# Patient Record
Sex: Female | Born: 1935 | Race: White | Hispanic: No | Marital: Married | Smoking: Former smoker
Health system: Southern US, Community
[De-identification: ages and names within clinical notes are randomized; demographics above are authoritative.]

## PROBLEM LIST (undated history)

## (undated) DIAGNOSIS — E7800 Pure hypercholesterolemia, unspecified: Secondary | ICD-10-CM

## (undated) DIAGNOSIS — D563 Thalassemia minor: Secondary | ICD-10-CM

## (undated) DIAGNOSIS — H3530 Unspecified macular degeneration: Secondary | ICD-10-CM

## (undated) DIAGNOSIS — K219 Gastro-esophageal reflux disease without esophagitis: Secondary | ICD-10-CM

## (undated) HISTORY — PX: ABDOMINAL HYSTERECTOMY: SHX81

---

## 1983-06-28 HISTORY — PX: BREAST SURGERY: SHX581

## 2004-08-12 ENCOUNTER — Ambulatory Visit: Payer: Self-pay | Admitting: Internal Medicine

## 2005-04-01 ENCOUNTER — Ambulatory Visit: Payer: Self-pay | Admitting: Unknown Physician Specialty

## 2005-09-22 ENCOUNTER — Ambulatory Visit: Payer: Self-pay | Admitting: Internal Medicine

## 2006-03-25 ENCOUNTER — Emergency Department: Payer: Self-pay | Admitting: Emergency Medicine

## 2006-10-02 ENCOUNTER — Ambulatory Visit: Payer: Self-pay | Admitting: Internal Medicine

## 2007-02-20 ENCOUNTER — Ambulatory Visit: Payer: Self-pay | Admitting: Unknown Physician Specialty

## 2007-12-09 ENCOUNTER — Ambulatory Visit: Payer: Self-pay | Admitting: Internal Medicine

## 2008-03-10 ENCOUNTER — Ambulatory Visit: Payer: Self-pay | Admitting: Nurse Practitioner

## 2008-05-01 ENCOUNTER — Emergency Department: Payer: Self-pay | Admitting: Emergency Medicine

## 2008-06-27 HISTORY — PX: EYE SURGERY: SHX253

## 2009-03-12 ENCOUNTER — Ambulatory Visit: Payer: Self-pay | Admitting: Nurse Practitioner

## 2009-04-11 ENCOUNTER — Ambulatory Visit: Payer: Self-pay | Admitting: Internal Medicine

## 2010-04-08 ENCOUNTER — Ambulatory Visit: Payer: Self-pay | Admitting: Nurse Practitioner

## 2011-07-13 ENCOUNTER — Ambulatory Visit: Payer: Self-pay

## 2011-12-18 ENCOUNTER — Ambulatory Visit: Payer: Self-pay | Admitting: Internal Medicine

## 2012-02-22 ENCOUNTER — Ambulatory Visit: Payer: Self-pay | Admitting: Ophthalmology

## 2012-03-14 ENCOUNTER — Ambulatory Visit: Payer: Self-pay | Admitting: Ophthalmology

## 2012-07-26 ENCOUNTER — Ambulatory Visit: Payer: Self-pay | Admitting: Internal Medicine

## 2013-08-01 ENCOUNTER — Ambulatory Visit: Payer: Self-pay | Admitting: Family Medicine

## 2014-08-19 ENCOUNTER — Ambulatory Visit: Payer: Self-pay | Admitting: Family Medicine

## 2015-03-12 ENCOUNTER — Ambulatory Visit
Admission: EM | Admit: 2015-03-12 | Discharge: 2015-03-12 | Disposition: A | Discharge status: Home / Self Care | Payer: Medicare Other | Attending: Family Medicine | Admitting: Family Medicine

## 2015-03-12 ENCOUNTER — Encounter: Payer: Self-pay | Admitting: Emergency Medicine

## 2015-03-12 DIAGNOSIS — T783XXD Angioneurotic edema, subsequent encounter: Secondary | ICD-10-CM | POA: Diagnosis not present

## 2015-03-12 HISTORY — DX: Gastro-esophageal reflux disease without esophagitis: K21.9

## 2015-03-12 HISTORY — DX: Pure hypercholesterolemia, unspecified: E78.00

## 2015-03-12 MED ORDER — PREDNISONE 10 MG (21) PO TBPK: ORAL_TABLET | ORAL | Status: AC

## 2015-03-12 MED ORDER — EPINEPHRINE 0.3 MG/0.3ML IJ SOAJ: 0.3000 mg | Freq: Once | INTRAMUSCULAR | Status: AC

## 2015-03-12 MED ORDER — LORATADINE 10 MG PO TABS: 10.0000 mg | ORAL_TABLET | Freq: Every day | ORAL | Status: AC

## 2015-03-12 MED ORDER — EPINEPHRINE HCL 1 MG/ML IJ SOLN
0.3000 mg | Freq: Once | INTRAMUSCULAR | Status: AC
  Administered 2015-03-12: 0.3 mg via SUBCUTANEOUS

## 2015-03-12 MED ORDER — LIDOCAINE-EPINEPHRINE-TETRACAINE (LET) SOLUTION: 3.0000 mL | Freq: Once | NASAL | Status: DC

## 2015-03-12 MED ORDER — METHYLPREDNISOLONE SODIUM SUCC 125 MG IJ SOLR
125.0000 mg | Freq: Once | INTRAMUSCULAR | Status: AC
  Administered 2015-03-12: 125 mg via INTRAMUSCULAR

## 2015-03-12 NOTE — ED Notes (Signed)
Patient c/o lip swelling that started last night.  Patient reports having an insect bite to her right hand on Saturday.

## 2015-03-12 NOTE — ED Provider Notes (Signed)
CSN: [id number]     Arrival date & time 03/12/15  7829 History   First MD Initiated Contact with Patient 03/12/15 0805     Chief Complaint  Patient presents with  . Oral Swelling    Patient has history of swelling of her lower lip and mouth that started yesterday. She states Saturday she was bitten by insect she has some swelling of her hand and some tingling of her lip that was all. She took some Benadryl and ice Sunday night erythema was resolved. She talked to Dr. Andrew Au who reassured her that unless things got worse and think should be fine. She will be noted about 3 or 4 years ago she had a similar episode of angioedema or her tongue swelled and she had facial swelling as well and she came in to be seen and was evaluated here. She's not on any type of ACE inhibitor and is not on any blood pressure medicine. She takes Pravachol for elevated cholesterol. She is concerned that the insect bite may have had something to do with this. She denies any shortness of breath tongue swelling this time but has had progressive swelling of the face lower lips which all started Wednesday night. Despite taking 2 tablets Benadryl last night and one this morning the swelling has continued with the face. (Consider location/radiation/quality/duration/timing/severity/associated sxs/prior Treatment) Patient is a 81 y.o. female presenting with allergic reaction. No language interpreter was used.  Allergic Reaction Presenting symptoms: itching, rash and swelling   Presenting symptoms: no difficulty breathing, no difficulty swallowing and no wheezing   Itching:    Location:  Perioral and mouth   Severity:  Moderate   Onset quality:  Sudden   Duration:  1 day   Timing:  Constant   Progression:  Worsening Severity:  Moderate Prior allergic episodes:  Unable to specify Context: insect bite/sting   Context comment:  The insect bite occurred Saturday and was thought to be resolved Relieved by:  Nothing Ineffective  treatments:  Antihistamines   Past Medical History  Diagnosis Date  . GERD (gastroesophageal reflux disease)   . Hypercholesteremia    Past Surgical History  Procedure Laterality Date  . Abdominal hysterectomy     History reviewed. No pertinent family history. Social History  Substance Use Topics  . Smoking status: Former Games developer  . Smokeless tobacco: Never Used  . Alcohol Use: Yes   OB History    No data available     Review of Systems  Constitutional: Negative for fever and chills.  HENT: Negative for trouble swallowing.   Respiratory: Negative for cough, choking, chest tightness, shortness of breath, wheezing and stridor.   Cardiovascular: Negative for chest pain.  Skin: Positive for itching and rash.  All other systems reviewed and are negative.   Allergies  Review of patient's allergies indicates no known allergies.  Home Medications   Prior to Admission medications   Medication Sig Start Date End Date Taking? Authorizing Provider  aspirin 81 MG tablet Take 81 mg by mouth daily.   Yes Historical Provider, MD  calcium-vitamin D (OSCAL WITH D) 500-200 MG-UNIT per tablet Take 1 tablet by mouth daily.   Yes Historical Provider, MD  Multiple Vitamin (MULTIVITAMIN) tablet Take 1 tablet by mouth daily.   Yes Historical Provider, MD  omega-3 acid ethyl esters (LOVAZA) 1 G capsule Take 1 g by mouth daily.   Yes Historical Provider, MD  pravastatin (PRAVACHOL) 10 MG tablet Take 5 mg by mouth daily.   Yes  Historical Provider, MD  EPINEPHrine 0.3 mg/0.3 mL IJ SOAJ injection Inject 0.3 mLs (0.3 mg total) into the muscle once. 03/12/15   Hassan Rowan, MD  loratadine (CLARITIN) 10 MG tablet Take 1 tablet (10 mg total) by mouth daily. Take 1 tablet in the morning. As needed for itching. 03/12/15   Hassan Rowan, MD  predniSONE (STERAPRED UNI-PAK 21 TAB) 10 MG (21) TBPK tablet Sig 6 tablet day 1, 5 tablets day 2, 4 tablets day 3,,3tablets day 4, 2 tablets day 5, 1 tablet day 6 take all  tablets orally 03/12/15   Hassan Rowan, MD   Meds Ordered and Administered this Visit   Medications  EPINEPHrine (ADRENALIN) injection 0.3 mg (not administered)  methylPREDNISolone sodium succinate (SOLU-MEDROL) 125 mg/2 mL injection 125 mg (125 mg Intramuscular Given 03/12/15 0855)    BP 123/64 mmHg  Pulse 63  Temp(Src) 96.9 F (36.1 C) (Tympanic)  Resp 16  Ht 5\' 2"  (1.575 m)  Wt 106 lb (48.081 kg)  BMI 19.38 kg/m2  SpO2 98% No data found.   Physical Exam  Constitutional: She appears well-developed and well-nourished.  HENT:  Head: Normocephalic and atraumatic.  Right Ear: Hearing and external ear normal.  Left Ear: Hearing and external ear normal.  Nose: Mucosal edema present.  Mouth/Throat: Oral lesions present. Normal dentition. No dental abscesses, uvula swelling or dental caries. Posterior oropharyngeal edema and posterior oropharyngeal erythema present.  Patient has marked swelling of the lower lip. She is also some swelling of the upper lip as well and on the side of face times unremarkable airways patent. There is erythema around the face. His no signs of any lesions inside the mouth is well  Eyes: Conjunctivae are normal. Pupils are equal, round, and reactive to light.  Neck: Normal range of motion. Neck supple.  Cardiovascular: Normal rate, regular rhythm and normal heart sounds.   Pulmonary/Chest: Effort normal and breath sounds normal.  Musculoskeletal: Normal range of motion. She exhibits no edema.  Neurological: She is alert. No cranial nerve deficit.  Skin: Skin is warm and dry. No rash noted. No erythema.  Psychiatric: She has a normal mood and affect.  Vitals reviewed.   ED Course  Procedures (including critical care time)  Labs Review Labs Reviewed - No data to display  Imaging Review No results found.   Visual Acuity Review  Right Eye Distance:   Left Eye Distance:   Bilateral Distance:    Right Eye Near:   Left Eye Near:    Bilateral  Near:         MDM   1. Angioedema of lips, subsequent encounter     Angioedema with recurrence from previous episode. Unlikely insect bite from Saturday unless it caused some type of amnestic response from before. While there is no airway constriction explained to her my concern that progression range edema could cause closing of her airway. I'm going to Mr. injection of epinephrine, Solu-Medrol and place on a six-day course prednisone since we do not know the cause of this episode. Also going to recommend Claritin 10 mg for least a week and stopped the Benadryl since cause sedation. Also recommend Zantac 150 twice a day but patient has declined and does not want a prescription for the medication states that things gets worse she'll see her doctor. I explained to her that this may be over treatment to her but she did have a history of her tongue swelling to the point her airway was compromised and I wonder  makes sure that doesn't happen again. Also that when the airways become compromised sometimes they do not get choice or chance to see another doctor and the people have died from airway restriction. She thanks me for my concern still declines prescription for Zantac. Will give prescription for Claritin and six-day course of prednisone and follow up with Dr. Maryclare Labrador try to keep her and watch her for low while longer.   Hassan Rowan, MD 03/12/15 [phone]

## 2015-03-12 NOTE — Discharge Instructions (Signed)
Angioedema Angioedema is sudden puffiness (swelling), often of the skin. It can happen:  On your face or privates (genitals).  In your belly (abdomen) or other body parts. It usually happens quickly and gets better in 1 or 2 days. It often starts at night and is found when you wake up. You may get red, itchy patches of skin (hives). Attacks can be dangerous if your breathing passages get puffy. The condition may happen only once, or it can come back at random times. It may happen for several years before it goes away for good. HOME CARE  Only take medicines as told by your doctor.  Always carry your emergency allergy medicines with you.  Wear a medical bracelet as told by your doctor.  Avoid things that you know will cause attacks (triggers). GET HELP IF:  You have another attack.  Your attacks happen more often or get worse.  The condition was passed to you by your parents and you want to have children. GET HELP RIGHT AWAY IF:   Your mouth, tongue, or lips are very puffy.  You have trouble breathing.  You have trouble swallowing.  You pass out (faint). MAKE SURE YOU:   Understand these instructions.  Will watch your condition.  Will get help right away if you are not doing well or get worse. Document Released: 06/01/2009 Document Revised: 04/03/2013 Document Reviewed: 02/04/2013 [facility] Patient Information 2015 [place], [place]. This information is not intended to replace advice given to you by your health care provider. Make sure you discuss any questions you have with your health care provider.  Epinephrine Injection Epinephrine is a medicine given by injection to temporarily treat an emergency allergic reaction. It is also used to treat severe asthmatic attacks and other lung problems. The medicine helps to enlarge (dilate) the small breathing tubes of the lungs. A life-threatening, sudden allergic reaction that involves the whole body is called anaphylaxis. Because of  potential side effects, epinephrine should only be used as directed by your caregiver. RISKS AND COMPLICATIONS Possible side effects of epinephrine injections include:  Chest pain.  Irregular or rapid heartbeat.  Shortness of breath.  Nausea.  Vomiting.  Abdominal pain or cramping.  Sweating.  Dizziness.  Weakness.  Headache.  Nervousness. Report all side effects to your caregiver. HOW TO GIVE AN EPINEPHRINE INJECTION Give the epinephrine injection immediately when symptoms of a severe reaction begin. Inject the medicine into the outer thigh or any available, large muscle. Your caregiver can teach you how to do this. You do not need to remove any clothing. After the injection, call your local emergency services (911 in [place] Even if you improve after the injection, you need to be examined at a hospital emergency department. Epinephrine works quickly, but it also wears off quickly. Delayed reactions can occur. A delayed reaction may be as serious and dangerous as the initial reaction. HOME CARE INSTRUCTIONS  Make sure you and your family know how to give an epinephrine injection.  Use epinephrine injections as directed by your caregiver. Do not use this medicine more often or in larger doses than prescribed.  Always carry your epinephrine injection or anaphylaxis kit with you. This can be lifesaving if you have a severe reaction.  Store the medicine in a cool, dry place. If the medicine becomes discolored or cloudy, dispose of it properly and replace it with new medicine.  Check the expiration date on your medicine. It may be unsafe to use medicines past their expiration date.  Tell your caregiver about any other medicines you are taking. Some medicines can react badly with epinephrine.  Tell your caregiver about any medical conditions you have, such as diabetes, high blood pressure (hypertension), heart disease, irregular heartbeats, or if you are pregnant. SEEK IMMEDIATE  MEDICAL CARE IF:  You have used an epinephrine injection. Call your local emergency services (911 in [place] Even if you improve after the injection, you need to be examined at a hospital emergency department to make sure your allergic reaction is under control. You will also be monitored for adverse effects from the medicine.  You have chest pain.  You have irregular or fast heartbeats.  You have shortness of breath.  You have severe headaches.  You have severe nausea, vomiting, or abdominal cramps.  You have severe pain, swelling, or redness in the area where you gave the injection. Document Released: 06/10/2000 Document Revised: 09/05/2011 Document Reviewed: 03/02/2011 [facility] Patient Information 2015 [place], [place]. This information is not intended to replace advice given to you by your health care provider. Make sure you discuss any questions you have with your health care provider.

## 2016-07-21 ENCOUNTER — Other Ambulatory Visit: Payer: Self-pay | Admitting: Family Medicine

## 2016-07-21 DIAGNOSIS — M8580 Other specified disorders of bone density and structure, unspecified site: Secondary | ICD-10-CM

## 2017-02-16 ENCOUNTER — Ambulatory Visit
Admission: RE | Admit: 2017-02-16 | Discharge: 2017-02-16 | Disposition: A | Discharge status: Home / Self Care | Payer: Medicare Other | Source: Ambulatory Visit | Attending: Family Medicine | Admitting: Family Medicine

## 2017-02-16 DIAGNOSIS — M8580 Other specified disorders of bone density and structure, unspecified site: Secondary | ICD-10-CM

## 2017-02-16 DIAGNOSIS — M85852 Other specified disorders of bone density and structure, left thigh: Secondary | ICD-10-CM | POA: Diagnosis not present

## 2017-05-23 ENCOUNTER — Encounter: Payer: Self-pay | Admitting: *Deleted

## 2017-05-24 ENCOUNTER — Encounter: Payer: Self-pay | Admitting: *Deleted

## 2017-05-24 ENCOUNTER — Ambulatory Visit: Payer: Medicare Other | Admitting: Anesthesiology

## 2017-05-24 ENCOUNTER — Encounter
Admission: RE | Disposition: A | Discharge status: Home / Self Care | Payer: Self-pay | Source: Ambulatory Visit | Attending: Unknown Physician Specialty

## 2017-05-24 ENCOUNTER — Ambulatory Visit
Admission: RE | Admit: 2017-05-24 | Discharge: 2017-05-24 | Disposition: A | Discharge status: Home / Self Care | Payer: Medicare Other | Source: Ambulatory Visit | Attending: Unknown Physician Specialty | Admitting: Unknown Physician Specialty

## 2017-05-24 DIAGNOSIS — Z79899 Other long term (current) drug therapy: Secondary | ICD-10-CM | POA: Insufficient documentation

## 2017-05-24 DIAGNOSIS — K640 First degree hemorrhoids: Secondary | ICD-10-CM | POA: Diagnosis not present

## 2017-05-24 DIAGNOSIS — E7800 Pure hypercholesterolemia, unspecified: Secondary | ICD-10-CM | POA: Insufficient documentation

## 2017-05-24 DIAGNOSIS — Z7952 Long term (current) use of systemic steroids: Secondary | ICD-10-CM | POA: Insufficient documentation

## 2017-05-24 DIAGNOSIS — Z87891 Personal history of nicotine dependence: Secondary | ICD-10-CM | POA: Diagnosis not present

## 2017-05-24 DIAGNOSIS — Z7982 Long term (current) use of aspirin: Secondary | ICD-10-CM | POA: Insufficient documentation

## 2017-05-24 DIAGNOSIS — Z1211 Encounter for screening for malignant neoplasm of colon: Secondary | ICD-10-CM | POA: Insufficient documentation

## 2017-05-24 DIAGNOSIS — K5730 Diverticulosis of large intestine without perforation or abscess without bleeding: Secondary | ICD-10-CM | POA: Insufficient documentation

## 2017-05-24 HISTORY — DX: Thalassemia minor: D56.3

## 2017-05-24 HISTORY — PX: COLONOSCOPY WITH PROPOFOL: SHX5780

## 2017-05-24 HISTORY — DX: Unspecified macular degeneration: H35.30

## 2017-05-24 SURGERY — COLONOSCOPY WITH PROPOFOL
Anesthesia: General

## 2017-05-24 MED ORDER — FENTANYL CITRATE (PF) 100 MCG/2ML IJ SOLN
INTRAMUSCULAR | Status: AC
  Filled 2017-05-24: qty 2

## 2017-05-24 MED ORDER — PHENYLEPHRINE HCL 10 MG/ML IJ SOLN
INTRAMUSCULAR | Status: DC | PRN
  Administered 2017-05-24: 50 ug via INTRAVENOUS

## 2017-05-24 MED ORDER — SODIUM CHLORIDE 0.9 % IV SOLN: INTRAVENOUS | Status: DC

## 2017-05-24 MED ORDER — SODIUM CHLORIDE 0.9 % IV SOLN
INTRAVENOUS | Status: DC
  Administered 2017-05-24: 08:00:00 via INTRAVENOUS

## 2017-05-24 MED ORDER — ONDANSETRON HCL 4 MG/2ML IJ SOLN
INTRAMUSCULAR | Status: DC | PRN
Start: 2017-05-24 — End: 2017-05-24
  Administered 2017-05-24: 4 mg via INTRAVENOUS

## 2017-05-24 MED ORDER — PROPOFOL 500 MG/50ML IV EMUL
INTRAVENOUS | Status: DC | PRN
  Administered 2017-05-24: 75 ug/kg/min via INTRAVENOUS

## 2017-05-24 MED ORDER — FENTANYL CITRATE (PF) 100 MCG/2ML IJ SOLN
INTRAMUSCULAR | Status: DC | PRN
  Administered 2017-05-24 (×4): 25 ug via INTRAVENOUS

## 2017-05-24 MED ORDER — GLYCOPYRROLATE 0.2 MG/ML IJ SOLN
INTRAMUSCULAR | Status: DC | PRN
  Administered 2017-05-24: 0.1 mg via INTRAVENOUS

## 2017-05-24 MED ORDER — PROPOFOL 10 MG/ML IV BOLUS
INTRAVENOUS | Status: DC | PRN
  Administered 2017-05-24: 40 mg via INTRAVENOUS

## 2017-05-24 NOTE — H&P (Signed)
Primary Care Physician:  Rolm GalaGrandis, Heidi, MD Primary Gastroenterologist:  Dr. Mechele CollinElliott  Pre-Procedure History & Physical: HPI:  Karla Schmidt is a 83 y.o. female is here for an colonoscopy.   Past Medical History:  Diagnosis Date  . GERD (gastroesophageal reflux disease)   . Hypercholesteremia   . Macular degeneration    bilateral  . Thalassemia minor     Past Surgical History:  Procedure Laterality Date  . ABDOMINAL HYSTERECTOMY    . BREAST SURGERY Right 1985  . EYE SURGERY  2010   cataract cortical, senile    Prior to Admission medications   Medication Sig Start Date End Date Taking? Authorizing Provider  aspirin 81 MG tablet Take 81 mg by mouth daily.   Yes [provider]  calcium-vitamin D (OSCAL WITH D) 500-200 MG-UNIT per tablet Take 1 tablet by mouth daily.   Yes [provider]  Multiple Vitamin (MULTIVITAMIN) tablet Take 1 tablet by mouth daily.   Yes [provider]  omega-3 acid ethyl esters (LOVAZA) 1 G capsule Take 1 g by mouth daily.   Yes [provider]  pravastatin (PRAVACHOL) 10 MG tablet Take 5 mg by mouth daily.   Yes [provider]  triamcinolone ointment (KENALOG) 0.1 % Apply 1 application topically 2 (two) times daily.   Yes [provider]  EPINEPHrine 0.3 mg/0.3 mL IJ SOAJ injection Inject 0.3 mLs (0.3 mg total) into the muscle once. Patient not taking: Reported on 05/24/2017 03/12/15   Hassan RowanWade, Eugene, MD  loratadine (CLARITIN) 10 MG tablet Take 1 tablet (10 mg total) by mouth daily. Take 1 tablet in the morning. As needed for itching. 03/12/15   Hassan RowanWade, Eugene, MD  predniSONE (STERAPRED UNI-PAK 21 TAB) 10 MG (21) TBPK tablet Sig 6 tablet day 1, 5 tablets day 2, 4 tablets day 3,,3tablets day 4, 2 tablets day 5, 1 tablet day 6 take all tablets orally 03/12/15   Hassan RowanWade, Eugene, MD    Allergies as of 02/23/2017  . (No Known Allergies)    History reviewed. No pertinent family history.  Social  History   Socioeconomic History  . Marital status: Married    Spouse name: Not on file  . Number of children: Not on file  . Years of education: Not on file  . Highest education level: Not on file  Social Needs  . Financial resource strain: Not on file  . Food insecurity - worry: Not on file  . Food insecurity - inability: Not on file  . Transportation needs - medical: Not on file  . Transportation needs - non-medical: Not on file  Occupational History  . Not on file  Tobacco Use  . Smoking status: Former Games developermoker  . Smokeless tobacco: Never Used  Substance and Sexual Activity  . Alcohol use: Yes    Alcohol/week: 0.6 oz    Types: 1 Glasses of wine per week    Comment: occasional  . Drug use: No  . Sexual activity: Not Currently  Other Topics Concern  . Not on file  Social History Narrative  . Not on file    Review of Systems: See HPI, otherwise negative ROS  Physical Exam: BP 117/60   Pulse 66   Temp (!) 96.8 F (36 C) (Tympanic)   Resp 16   Ht 5\' 2"  (1.575 m)   Wt 48.1 kg (106 lb)   SpO2 100%   BMI 19.39 kg/m  General:   Alert,  pleasant and cooperative in NAD Head:  Normocephalic and atraumatic. Neck:  Supple; no masses or thyromegaly. Lungs:  Clear throughout to auscultation.    Heart:  Regular rate and rhythm. Abdomen:  Soft, nontender and nondistended. Normal bowel sounds, without guarding, and without rebound.   Neurologic:  Alert and  oriented x4;  grossly normal neurologically.  Impression/Plan: Karla NevinNatalie Pirrello Schmidt is here for an colonoscopy to be performed for colon cancer screening.  Risks, benefits, limitations, and alternatives regarding  colonoscopy have been reviewed with the patient.  Questions have been answered.  All parties agreeable.   Lynnae PrudeELLIOTT, ROBERT, MD  05/24/2017, 8:36 AM

## 2017-05-24 NOTE — Transfer of Care (Signed)
Immediate Anesthesia Transfer of Care Note  Patient: Karla Schmidt  Procedure(s) Performed: COLONOSCOPY WITH PROPOFOL (N/A )  Patient Location: PACU  Anesthesia Type:General  Level of Consciousness: awake, alert  and oriented  Airway & Oxygen Therapy: Patient Spontanous Breathing and Patient connected to nasal cannula oxygen  Post-op Assessment: Report given to RN, Post -op Vital signs reviewed and stable and Patient moving all extremities  Post vital signs: Reviewed and stable  Last Vitals:  Vitals:   05/24/17 0747  BP: 117/60  Pulse: 66  Resp: 16  Temp: (!) 36 C  SpO2: 100%    Last Pain:  Vitals:   05/24/17 0747  TempSrc: Tympanic         Complications: No apparent anesthesia complications

## 2017-05-24 NOTE — Anesthesia Preprocedure Evaluation (Signed)
Anesthesia Evaluation  Patient identified by MRN, date of birth, ID band Patient awake    Reviewed: Allergy & Precautions, H&P , NPO status , Patient's Chart, lab work & pertinent test results, reviewed documented beta blocker date and time   History of Anesthesia Complications Negative for: history of anesthetic complications  Airway Mallampati: I  TM Distance: >3 FB Neck ROM: full    Dental  (+) Implants, Dental Advidsory Given, Teeth Intact   Pulmonary neg pulmonary ROS, former smoker,           Cardiovascular Exercise Tolerance: Good negative cardio ROS       Neuro/Psych negative neurological ROS  negative psych ROS   GI/Hepatic Neg liver ROS, GERD  ,  Endo/Other  negative endocrine ROS  Renal/GU negative Renal ROS  negative genitourinary   Musculoskeletal   Abdominal   Peds  Hematology  (+) Blood dyscrasia (thalassemia minor), anemia ,   Anesthesia Other Findings Past Medical History: No date: GERD (gastroesophageal reflux disease) No date: Hypercholesteremia No date: Macular degeneration     Comment:  bilateral No date: Thalassemia minor   Reproductive/Obstetrics negative OB ROS                             Anesthesia Physical Anesthesia Plan  ASA: II  Anesthesia Plan: General   Post-op Pain Management:    Induction: Intravenous  PONV Risk Score and Plan: 3 and Propofol infusion  Airway Management Planned: Nasal Cannula  Additional Equipment:   Intra-op Plan:   Post-operative Plan:   Informed Consent: I have reviewed the patients History and Physical, chart, labs and discussed the procedure including the risks, benefits and alternatives for the proposed anesthesia with the patient or authorized representative who has indicated his/her understanding and acceptance.   Dental Advisory Given  Plan Discussed with: Anesthesiologist, CRNA and Surgeon  Anesthesia  Plan Comments:         Anesthesia Quick Evaluation

## 2017-05-24 NOTE — Anesthesia Postprocedure Evaluation (Signed)
Anesthesia Post Note  Patient: Karla Schmidt  Procedure(s) Performed: COLONOSCOPY WITH PROPOFOL (N/A )  Patient location during evaluation: Endoscopy Anesthesia Type: General Level of consciousness: awake and alert Pain management: pain level controlled Vital Signs Assessment: post-procedure vital signs reviewed and stable Respiratory status: spontaneous breathing, nonlabored ventilation, respiratory function stable and patient connected to nasal cannula oxygen Cardiovascular status: blood pressure returned to baseline and stable Postop Assessment: no apparent nausea or vomiting Anesthetic complications: no     Last Vitals:  Vitals:   05/24/17 0956 05/24/17 1005  BP:  (!) 91/45  Pulse:  63  Resp: 14 18  Temp:    SpO2:  100%    Last Pain:  Vitals:   05/24/17 1005  TempSrc:   PainSc: 0-No pain                 Lenard SimmerAndrew Mehul Rudin

## 2017-05-24 NOTE — Op Note (Signed)
[facility] Gastroenterology Patient Name: Karla Callanderatalie Stonerock Procedure Date: 05/24/2017 8:39 AM MRN: [id number] Account #: [id number] Date of Birth: 10-26-1933 Admit Type: Outpatient Age: 83 Room: [facility] ENDO ROOM 3 Gender: Female Note Status: Finalized Procedure:            Colonoscopy Indications:          Screening for colorectal malignant neoplasm Providers:            Scot Junobert T. Adel Neyer, MD Medicines:            Propofol per Anesthesia Complications:        No immediate complications. Procedure:            Pre-Anesthesia Assessment:                       - After reviewing the risks and benefits, the patient                        was deemed in satisfactory condition to undergo the                        procedure.                       After obtaining informed consent, the colonoscope was                        passed under direct vision. Throughout the procedure,                        the patient's blood pressure, pulse, and oxygen                        saturations were monitored continuously. The                        Colonoscope was introduced through the anus and                        advanced to the the cecum, identified by appendiceal                        orifice and ileocecal valve. The colonoscopy was                        somewhat difficult due to significant looping and a                        tortuous colon. Successful completion of the procedure                        was aided by changing endoscopes. The quality of the                        bowel preparation was excellent. Findings:      The colon was difficult so I switched to an EGD scope and got to the       proximal ascending colon. I then removed that scope and passed a       pediatric colonoscope to the cecum.      A few small-mouthed diverticula were found in the sigmoid colon.  Internal hemorrhoids were found during endoscopy. The hemorrhoids were       small and Grade I (internal  hemorrhoids that do not prolapse).      The exam was otherwise without abnormality. Impression:           - Diverticulosis in the sigmoid colon.                       - Internal hemorrhoids.                       - The examination was otherwise normal.                       - No specimens collected. Recommendation:       - The findings and recommendations were discussed with                        the patient's family. Scot Junobert T Aury Scollard, MD 05/24/2017 9:23:09 AM This report has been signed electronically. Number of Addenda: 0 Note Initiated On: 05/24/2017 8:39 AM Scope Withdrawal Time: 0 hours 4 minutes 13 seconds  Total Procedure Duration: 0 hours 33 minutes 55 seconds       [facility]

## 2017-05-24 NOTE — Anesthesia Post-op Follow-up Note (Signed)
Anesthesia QCDR form completed.        

## 2017-05-25 ENCOUNTER — Encounter: Payer: Self-pay | Admitting: Unknown Physician Specialty

## 2019-04-09 ENCOUNTER — Other Ambulatory Visit: Payer: Self-pay

## 2019-04-09 DIAGNOSIS — Z20822 Contact with and (suspected) exposure to covid-19: Secondary | ICD-10-CM

## 2019-04-11 LAB — NOVEL CORONAVIRUS, NAA: SARS-CoV-2, NAA: NOT DETECTED
